# Patient Record
Sex: Male | Born: 1975 | Race: Black or African American | Hispanic: No | Marital: Married
Health system: Southern US, Community
[De-identification: ages and names within clinical notes are randomized; demographics above are authoritative.]

---

## 2020-03-20 ENCOUNTER — Ambulatory Visit: Payer: Self-pay

## 2020-08-08 ENCOUNTER — Ambulatory Visit
Admission: RE | Admit: 2020-08-08 | Discharge: 2020-08-08 | Disposition: A | Discharge status: Home / Self Care | Payer: BC Managed Care – PPO | Source: Ambulatory Visit | Attending: Internal Medicine | Admitting: Internal Medicine

## 2020-08-08 ENCOUNTER — Other Ambulatory Visit: Payer: Self-pay | Admitting: Internal Medicine

## 2020-08-08 DIAGNOSIS — M5430 Sciatica, unspecified side: Secondary | ICD-10-CM

## 2020-12-22 ENCOUNTER — Observation Stay (HOSPITAL_COMMUNITY)
Admission: EM | Admit: 2020-12-22 | Discharge: 2020-12-23 | Disposition: A | Discharge status: Home / Self Care | Payer: BC Managed Care – PPO | Attending: Neurological Surgery | Admitting: Neurological Surgery

## 2020-12-22 ENCOUNTER — Emergency Department (HOSPITAL_COMMUNITY): Payer: BC Managed Care – PPO

## 2020-12-22 DIAGNOSIS — Z419 Encounter for procedure for purposes other than remedying health state, unspecified: Secondary | ICD-10-CM

## 2020-12-22 DIAGNOSIS — M5416 Radiculopathy, lumbar region: Secondary | ICD-10-CM | POA: Diagnosis present

## 2020-12-22 DIAGNOSIS — U071 COVID-19: Secondary | ICD-10-CM | POA: Diagnosis not present

## 2020-12-22 DIAGNOSIS — M5116 Intervertebral disc disorders with radiculopathy, lumbar region: Secondary | ICD-10-CM | POA: Insufficient documentation

## 2020-12-22 DIAGNOSIS — G834 Cauda equina syndrome: Secondary | ICD-10-CM | POA: Diagnosis not present

## 2020-12-22 DIAGNOSIS — M48061 Spinal stenosis, lumbar region without neurogenic claudication: Secondary | ICD-10-CM | POA: Insufficient documentation

## 2020-12-22 DIAGNOSIS — M5126 Other intervertebral disc displacement, lumbar region: Secondary | ICD-10-CM

## 2020-12-22 DIAGNOSIS — R200 Anesthesia of skin: Secondary | ICD-10-CM

## 2020-12-22 LAB — CBC WITH DIFFERENTIAL/PLATELET
Abs Immature Granulocytes: 0.02 10*3/uL (ref 0.00–0.07)
Basophils Absolute: 0 10*3/uL (ref 0.0–0.1)
Basophils Relative: 0 %
Eosinophils Absolute: 0 10*3/uL (ref 0.0–0.5)
Eosinophils Relative: 0 %
HCT: 44 % (ref 39.0–52.0)
Hemoglobin: 15.6 g/dL (ref 13.0–17.0)
Immature Granulocytes: 0 %
Lymphocytes Relative: 20 %
Lymphs Abs: 1.3 10*3/uL (ref 0.7–4.0)
MCH: 29.4 pg (ref 26.0–34.0)
MCHC: 35.5 g/dL (ref 30.0–36.0)
MCV: 83 fL (ref 80.0–100.0)
Monocytes Absolute: 0.3 10*3/uL (ref 0.1–1.0)
Monocytes Relative: 4 %
Neutro Abs: 4.9 10*3/uL (ref 1.7–7.7)
Neutrophils Relative %: 76 %
Platelets: 207 10*3/uL (ref 150–400)
RBC: 5.3 MIL/uL (ref 4.22–5.81)
RDW: 11.5 % (ref 11.5–15.5)
WBC: 6.6 10*3/uL (ref 4.0–10.5)
nRBC: 0 % (ref 0.0–0.2)

## 2020-12-22 LAB — URINALYSIS, ROUTINE W REFLEX MICROSCOPIC
Bilirubin Urine: NEGATIVE
Glucose, UA: NEGATIVE mg/dL
Hgb urine dipstick: NEGATIVE
Ketones, ur: 20 mg/dL — AB
Leukocytes,Ua: NEGATIVE
Nitrite: NEGATIVE
Protein, ur: NEGATIVE mg/dL
Specific Gravity, Urine: 1.013 (ref 1.005–1.030)
pH: 7 (ref 5.0–8.0)

## 2020-12-22 LAB — RESP PANEL BY RT-PCR (FLU A&B, COVID) ARPGX2
Influenza A by PCR: NEGATIVE
Influenza B by PCR: NEGATIVE
SARS Coronavirus 2 by RT PCR: POSITIVE — AB

## 2020-12-22 LAB — PROTIME-INR
INR: 1 (ref 0.8–1.2)
Prothrombin Time: 13.2 seconds (ref 11.4–15.2)

## 2020-12-22 LAB — COMPREHENSIVE METABOLIC PANEL
ALT: 46 U/L — ABNORMAL HIGH (ref 0–44)
AST: 45 U/L — ABNORMAL HIGH (ref 15–41)
Albumin: 3.7 g/dL (ref 3.5–5.0)
Alkaline Phosphatase: 62 U/L (ref 38–126)
Anion gap: 11 (ref 5–15)
BUN: 7 mg/dL (ref 6–20)
CO2: 27 mmol/L (ref 22–32)
Calcium: 9 mg/dL (ref 8.9–10.3)
Chloride: 97 mmol/L — ABNORMAL LOW (ref 98–111)
Creatinine, Ser: 1.08 mg/dL (ref 0.61–1.24)
GFR, Estimated: >60 mL/min (ref >60)
Glucose, Bld: 100 mg/dL — ABNORMAL HIGH (ref 70–99)
Potassium: 3.5 mmol/L (ref 3.5–5.1)
Sodium: 135 mmol/L (ref 135–145)
Total Bilirubin: 0.9 mg/dL (ref 0.3–1.2)
Total Protein: 7.4 g/dL (ref 6.5–8.1)

## 2020-12-22 LAB — APTT: aPTT: 20 seconds — ABNORMAL LOW (ref 24–36)

## 2020-12-22 LAB — HIV ANTIBODY (ROUTINE TESTING W REFLEX): HIV Screen 4th Generation wRfx: NONREACTIVE

## 2020-12-22 MED ORDER — CEFAZOLIN SODIUM-DEXTROSE 2-4 GM/100ML-% IV SOLN: 2.0000 g | INTRAVENOUS | Status: DC

## 2020-12-22 MED ORDER — GADOBUTROL 1 MMOL/ML IV SOLN
9.0000 mL | Freq: Once | INTRAVENOUS | Status: AC | PRN
  Administered 2020-12-22: 9 mL via INTRAVENOUS

## 2020-12-22 MED ORDER — SODIUM CHLORIDE 0.9% FLUSH: 3.0000 mL | INTRAVENOUS | Status: DC | PRN

## 2020-12-22 MED ORDER — DEXAMETHASONE SODIUM PHOSPHATE 10 MG/ML IJ SOLN
10.0000 mg | Freq: Once | INTRAMUSCULAR | Status: AC
  Administered 2020-12-22: 10 mg via INTRAVENOUS
  Filled 2020-12-22: qty 1

## 2020-12-22 MED ORDER — SENNOSIDES-DOCUSATE SODIUM 8.6-50 MG PO TABS: 1.0000 | ORAL_TABLET | Freq: Every evening | ORAL | Status: DC | PRN

## 2020-12-22 MED ORDER — METHOCARBAMOL 1000 MG/10ML IJ SOLN
500.0000 mg | Freq: Four times a day (QID) | INTRAVENOUS | Status: DC | PRN
  Filled 2020-12-22 (×2): qty 5

## 2020-12-22 MED ORDER — HYDROMORPHONE HCL 1 MG/ML IJ SOLN
0.5000 mg | Freq: Once | INTRAMUSCULAR | Status: AC
  Administered 2020-12-22: 0.5 mg via INTRAVENOUS
  Filled 2020-12-22: qty 1

## 2020-12-22 MED ORDER — SODIUM CHLORIDE 0.9 % IV SOLN: 250.0000 mL | INTRAVENOUS | Status: DC | PRN

## 2020-12-22 MED ORDER — HYDROCODONE-ACETAMINOPHEN 5-325 MG PO TABS: 1.0000 | ORAL_TABLET | ORAL | Status: DC | PRN

## 2020-12-22 MED ORDER — ONDANSETRON HCL 4 MG PO TABS: 4.0000 mg | ORAL_TABLET | Freq: Four times a day (QID) | ORAL | Status: DC | PRN

## 2020-12-22 MED ORDER — SODIUM CHLORIDE 0.9% FLUSH
3.0000 mL | Freq: Two times a day (BID) | INTRAVENOUS | Status: DC
  Administered 2020-12-22: 3 mL via INTRAVENOUS

## 2020-12-22 MED ORDER — ONDANSETRON HCL 4 MG/2ML IJ SOLN: 4.0000 mg | Freq: Four times a day (QID) | INTRAMUSCULAR | Status: DC | PRN

## 2020-12-22 MED ORDER — SODIUM CHLORIDE 0.9 % IV SOLN: INTRAVENOUS | Status: DC

## 2020-12-22 NOTE — ED Triage Notes (Signed)
Pt here ems COVID+ on 12-28. pt received covid vax x2 & received booster on 12/23. Pt co bilateral leg pain from hip to toes feels tingling in nature. Denies injury to area. Denies calf pain and RN palpated bilateral dorsal pulses.

## 2020-12-22 NOTE — H&P (Signed)
Providing Compassionate, Quality Care - Together  NEUROSURGERY HISTORY & PHYSICAL   Allen Page is an 44 y.o. male.   Chief Complaint: Bilateral lower extremity numbness and difficulty urinating HPI: This is a pleasant 44 year old male with no significant past medical history that has been treated as an outpatient for low back pain and herniated disc.  He recently had epidural steroid injection a few weeks ago.  He has been dealing with low back pain and bilateral right greater than left lower extremity pain for months.  Over the past 3 months he has noted groin numbness, some difficulty initiating and knowing when he is urinating.  He denies any difficulty with bowel movements or urinary incontinence.  He denies any focal weakness but he does feel as though his legs are not as strong.  He complains of significant pain when he stands and it is now causing him significant pain to ambulate therefore he came to the emergency department.  No past medical history on file.   No family history on file. Social History:  has no history on file for tobacco use, alcohol use, and drug use.  Allergies: No Known Allergies    Results for orders placed or performed during the hospital encounter of 12/22/20 (from the past 48 hour(s))  Comprehensive metabolic panel     Status: Abnormal   Collection Time: 12/22/20  2:53 PM  Result Value Ref Range   Sodium 135 135 - 145 mmol/L   Potassium 3.5 3.5 - 5.1 mmol/L   Chloride 97 (L) 98 - 111 mmol/L   CO2 27 22 - 32 mmol/L   Glucose, Bld 100 (H) 70 - 99 mg/dL    Comment: Glucose reference range applies only to samples taken after fasting for at least 8 hours.   BUN 7 6 - 20 mg/dL   Creatinine, Ser 4.40 0.61 - 1.24 mg/dL   Calcium 9.0 8.9 - 10.2 mg/dL   Total Protein 7.4 6.5 - 8.1 g/dL   Albumin 3.7 3.5 - 5.0 g/dL   AST 45 (H) 15 - 41 U/L   ALT 46 (H) 0 - 44 U/L   Alkaline Phosphatase 62 38 - 126 U/L   Total Bilirubin 0.9 0.3 - 1.2 mg/dL    GFR, Estimated >72 >53 mL/min    Comment: (NOTE) Calculated using the CKD-EPI Creatinine Equation (2021)    Anion gap 11 5 - 15    Comment: Performed at [facility], [address] [place], [place] [address]  CBC with Differential     Status: None   Collection Time: 12/22/20  2:53 PM  Result Value Ref Range   WBC 6.6 4.0 - 10.5 K/uL   RBC 5.30 4.22 - 5.81 MIL/uL   Hemoglobin 15.6 13.0 - 17.0 g/dL   HCT 34.7 42.5 - 95.6 %   MCV 83.0 80.0 - 100.0 fL   MCH 29.4 26.0 - 34.0 pg   MCHC 35.5 30.0 - 36.0 g/dL   RDW 38.7 56.4 - 33.2 %   Platelets 207 150 - 400 K/uL   nRBC 0.0 0.0 - 0.2 %   Neutrophils Relative % 76 %   Neutro Abs 4.9 1.7 - 7.7 K/uL   Lymphocytes Relative 20 %   Lymphs Abs 1.3 0.7 - 4.0 K/uL   Monocytes Relative 4 %   Monocytes Absolute 0.3 0.1 - 1.0 K/uL   Eosinophils Relative 0 %   Eosinophils Absolute 0.0 0.0 - 0.5 K/uL   Basophils Relative 0 %  Basophils Absolute 0.0 0.0 - 0.1 K/uL   Immature Granulocytes 0 %   Abs Immature Granulocytes 0.02 0.00 - 0.07 K/uL    Comment: Performed at [facility], [address] [place], [place] [address]  Urinalysis, Routine w reflex microscopic Urine, Clean Catch     Status: Abnormal   Collection Time: 12/22/20  3:56 PM  Result Value Ref Range   Color, Urine YELLOW YELLOW   APPearance CLEAR CLEAR   Specific Gravity, Urine 1.013 1.005 - 1.030   pH 7.0 5.0 - 8.0   Glucose, UA NEGATIVE NEGATIVE mg/dL   Hgb urine dipstick NEGATIVE NEGATIVE   Bilirubin Urine NEGATIVE NEGATIVE   Ketones, ur 20 (A) NEGATIVE mg/dL   Protein, ur NEGATIVE NEGATIVE mg/dL   Nitrite NEGATIVE NEGATIVE   Leukocytes,Ua NEGATIVE NEGATIVE    Comment: Performed at [facility], [address] [place], [place] [address]  Resp Panel by RT-PCR (Flu A&B, Covid) Urine, Clean Catch     Status: Abnormal   Collection Time: 12/22/20  3:56 PM   Specimen: Urine, Clean Catch; Nasopharyngeal(NP) swabs in vial transport medium  Result Value Ref  Range   SARS Coronavirus 2 by RT PCR POSITIVE (A) NEGATIVE    Comment: RESULT CALLED TO, READ BACK BY AND VERIFIED WITH: E DIXON RN 1724 12/22/20 A BROWNING (NOTE) SARS-CoV-2 target nucleic acids are DETECTED.  The SARS-CoV-2 RNA is generally detectable in upper respiratory specimens during the acute phase of infection. Positive results are indicative of the presence of the identified virus, but do not rule out bacterial infection or co-infection with other pathogens not detected by the test. Clinical correlation with patient history and other diagnostic information is necessary to determine patient infection status. The expected result is Negative.  Fact Sheet for Patients: BloggerCourse.com  Fact Sheet for Healthcare Providers: SeriousBroker.it  This test is not yet approved or cleared by the [place] FDA and  has been authorized for detection and/or diagnosis of SARS-CoV-2 by FDA under an Emergency Use Authorization (EUA).  This EUA will remain in effect (meaning this test can  be used) for the duration of  the COVID-19 declaration under Section 564(b)(1) of the Act, 21 U.S.C. section 360bbb-3(b)(1), unless the authorization is terminated or revoked sooner.     Influenza A by PCR NEGATIVE NEGATIVE   Influenza B by PCR NEGATIVE NEGATIVE    Comment: (NOTE) The Xpert Xpress SARS-CoV-2/FLU/RSV plus assay is intended as an aid in the diagnosis of influenza from Nasopharyngeal swab specimens and should not be used as a sole basis for treatment. Nasal washings and aspirates are unacceptable for Xpert Xpress SARS-CoV-2/FLU/RSV testing.  Fact Sheet for Patients: BloggerCourse.com  Fact Sheet for Healthcare Providers: SeriousBroker.it  This test is not yet approved or cleared by the [place] FDA and has been authorized for detection and/or diagnosis of SARS-CoV-2 by FDA  under an Emergency Use Authorization (EUA). This EUA will remain in effect (meaning this test can be used) for the duration of the COVID-19 declaration under Section 564(b)(1) of the Act, 21 U.S.C. section 360bbb-3(b)(1), unless the authorization is terminated or revoked.  Performed at [facility], [address] [place], [place] [address]   Protime-INR     Status: None   Collection Time: 12/22/20  8:50 PM  Result Value Ref Range   Prothrombin Time 13.2 11.4 - 15.2 seconds   INR 1.0 0.8 - 1.2    Comment: (NOTE) INR goal varies based on device  and disease states. Performed at [facility], [address], [place], [place] [address]   APTT     Status: Abnormal   Collection Time: 12/22/20  8:50 PM  Result Value Ref Range   aPTT 20 (L) 24 - 36 seconds    Comment: Performed at [facility], [address], [place], [place] [address]   MR Lumbar Spine W Wo Contrast  Result Date: 12/22/2020 CLINICAL DATA:  Low back pain.  Suspect cauda equina syndrome. EXAM: MRI LUMBAR SPINE WITHOUT AND WITH CONTRAST TECHNIQUE: Multiplanar and multiecho pulse sequences of the lumbar spine were obtained without and with intravenous contrast. CONTRAST:  55mL GADAVIST GADOBUTROL 1 MMOL/ML IV SOLN COMPARISON:  Lumbar radiographs 08/08/2020 FINDINGS: Segmentation:  Normal Alignment:  Mild retrolisthesis L3-4 L4-5.  Lumbar lordosis. Vertebrae:  Normal bone marrow.  Negative for fracture or mass. Conus medullaris and cauda equina: Conus extends to the T12-L1 level. Conus and cauda equina appear normal. Paraspinal and other soft tissues: Negative for paraspinous mass or adenopathy. Disc levels: Congenitally small canal due to short pedicles. T12-L1: Shallow central disc protrusion without significant stenosis L1-2: Shallow central disc protrusion.  No significant stenosis. L2-3: Shallow central disc protrusion without significant stenosis. L3-4: Disc degeneration with diffuse disc bulging and endplate  spurring. Moderately large central and right-sided disc protrusion compressing the thecal sac right greater than left. Mild to moderate spinal stenosis. Mild facet degeneration. Moderate right subarticular stenosis. L4-5: Large extruded disc fragment extending into the spinal canal and compressing thecal sac causing severe spinal stenosis. Bilateral mild facet degeneration. Moderate to severe subarticular stenosis bilaterally L5-S1: Shallow central disc protrusion without significant stenosis IMPRESSION: 1. Congenital spinal stenosis. Shallow central disc protrusion T12-L1, L1-2, L2-3 2. Mild to moderate spinal stenosis L3-4 with moderate large central right-sided disc protrusion 3. Large extruded disc fragment centrally at L4-5 causing severe spinal stenosis. Moderate to severe subarticular stenosis bilaterally. Electronically Signed   By: Marlan Palau M.D.   On: 12/22/2020 18:43    ROS 14 point review of systems was obtained which all pertinent positives are listed in HPI above   Blood pressure 135/81, pulse 75, temperature 98.8 F (37.1 C), temperature source Oral, resp. rate 18, weight 90.7 kg, SpO2 99 %. Physical Exam  A&O x3 PERRLA EOMI Bilateral upper extremities 5/5 Cranial nerves II through XII intact Decrease sensation to groin, subjectively Decreased sensation to multiple dermatomes, L4, L5, S1 bilaterally Bilateral lower extremity 4/5 strength Deep tendon reflexes normal Per ED, rectal tone normal    Assessment/Plan 44 year old male with  1.  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  Cauda equina syndrome 3.  Covid positive   -OR tomorrow for L4-5 microdiscectomy -All risks, benefits, alternatives discussed with the patient and agreed upon.  I told him the risks that include but are not limited to heart attack, stroke, death, wound infection, hematoma, need for more surgery, CSF leak, permanent or temporary nerve damage.  He agrees to proceed. -pain  control -admit to floor -NPO midnight -Isolation for Covid status  Thank you for allowing me to participate in this patient's care.  Please do not hesitate to call with questions or concerns.   Monia Pouch, DO Neurosurgeon [facility] Cell: [phone]

## 2020-12-22 NOTE — ED Notes (Signed)
Pt still not back from MRI

## 2020-12-22 NOTE — ED Notes (Signed)
Pt tranported to MRI at this time.

## 2020-12-22 NOTE — ED Provider Notes (Signed)
[facility] EMERGENCY DEPARTMENT Provider Note   CSN: [id number] Arrival date & time: 12/22/20  1251     History Chief Complaint  Patient presents with  . Leg Pain    Allen Page is a 44 y.o. male with PMH/o sciatica, positive RPR test, recent COVID positive who presents for evaluation of leg pain, leg numbness/weakness.  He states he has a history of lower back issues and states that he has been seen for back problems before.  He states that he has had bad, had pain from that sometimes down his left leg.  He states he got injection in his back and his hip a couple weeks ago to help with the pain.  He also reports he got his flu shot and Covid booster about 2 to 3 weeks ago.  Reports that he did an at-home test and it was positive.  He states that over the last 2 to 3 days, he has felt some pain and tingling in his legs.  He states that his both his legs but his right one is worse.  He states that towards his foot and ankle and lower leg, he feels a tingling sensation and cannot really feel as well as he normally can.  He states that this goes up all the way to his thighs and his lower back.  He states that he has also had difficulty urinating.  He states that he feels the urge to go but then when he goes, he cannot urinate.  He states he had to sit on the toilet for about 30 minutes before he can go.  He states that sometimes when he goes, he does not feel that position that he has not had any episodes where he has urinated or had a bowel movement on himself.  He reports that he has some numbness in his groin as well.  He states he has had difficulty walking secondary symptoms.  He reports he is normally able to ambulate without any difficulty.  He denies any history of trauma, injury, fall.  He is never had any back surgery and denies any IV drug use.  He has not noted any fevers.  Denies any chest pain, difficulty breathing.   The history is provided by the patient.        No past medical history on file.  Patient Active Problem List   Diagnosis Date Noted  . Cauda equina compression (HCC) 12/22/2020     The histories are not reviewed yet. Please review them in the "History" navigator section and refresh this SmartLink.     No family history on file.     Home Medications Prior to Admission medications   Medication Sig Start Date End Date Taking? Authorizing Provider  diclofenac (VOLTAREN) 75 MG EC tablet Take 75 mg by mouth 2 (two) times daily. 12/06/20  Yes [provider]  gabapentin (NEURONTIN) 300 MG capsule Take 300 mg by mouth daily. 08/22/20  Yes [provider]  Multiple Vitamin (MULTIVITAMIN) tablet Take 1 tablet by mouth daily.   Yes [provider]  traMADol (ULTRAM) 50 MG tablet Take 50 mg by mouth every 6 (six) hours as needed for moderate pain. 08/10/20  Yes [provider]    Allergies    Patient has no known allergies.  Review of Systems   Review of Systems  Constitutional: Negative for fever.  Respiratory: Negative for cough and shortness of breath.   Cardiovascular: Negative for chest pain.  Gastrointestinal:  Negative for abdominal pain, nausea and vomiting.  Genitourinary: Positive for difficulty urinating.  Musculoskeletal: Positive for back pain.  Neurological: Positive for weakness and numbness.  All other systems reviewed and are negative.   Physical Exam Updated Vital Signs BP 135/81   Pulse 75   Temp 98.8 F (37.1 C) (Oral)   Resp 18   Wt 90.7 kg   SpO2 99%   Physical Exam Vitals and nursing note reviewed. Exam conducted with a chaperone present.  Constitutional:      Appearance: Normal appearance. He is well-developed and well-nourished.  HENT:     Head: Normocephalic and atraumatic.     Mouth/Throat:     Mouth: Oropharynx is clear and moist and mucous membranes are normal.  Eyes:     General: Lids are normal.     Extraocular Movements: EOM normal.      Conjunctiva/sclera: Conjunctivae normal.     Pupils: Pupils are equal, round, and reactive to light.  Cardiovascular:     Rate and Rhythm: Normal rate and regular rhythm.     Pulses: Normal pulses.          Radial pulses are 2+ on the right side and 2+ on the left side.       Dorsalis pedis pulses are 2+ on the right side and 2+ on the left side.     Heart sounds: Normal heart sounds. No murmur heard. No friction rub. No gallop.   Pulmonary:     Effort: Pulmonary effort is normal.     Breath sounds: Normal breath sounds.     Comments: Lungs clear to auscultation bilaterally.  Symmetric chest rise.  No wheezing, rales, rhonchi. Abdominal:     Palpations: Abdomen is soft. Abdomen is not rigid.     Tenderness: There is no abdominal tenderness. There is no guarding.     Comments: Abdomen is soft, non-distended, non-tender. No rigidity, No guarding. No peritoneal signs.  Genitourinary:    Comments: The exam was performed with a chaperone present Irving Burton, RN). Normal anal spinchter tone. Reports decreased sensation to palpation of scrotum.  Musculoskeletal:        General: Normal range of motion.     Cervical back: Full passive range of motion without pain.     Comments: Mild tenderness noted diffusely to the lower lumbar region. No overlying warmth, erythema or edema.   Skin:    General: Skin is warm and dry.     Capillary Refill: Capillary refill takes less than 2 seconds.  Neurological:     Mental Status: He is alert and oriented to person, place, and time.     Deep Tendon Reflexes:     Reflex Scores:      Patellar reflexes are 2+ on the right side and 2+ on the left side.      Achilles reflexes are 1+ on the right side and 2+ on the left side.    Comments: Reports decreased sensation noted from the mid tib-fib of the right lower leg that extends distally.  He also reports tingling noted to the left lower extremity.  Painful stimuli noted to the plantar surface of the right lower  extremity, he minimally responds whereas with the left, he has more response.  2+ patellar reflexes bilaterally.  Slightly diminished Achilles reflex on the right lower extremity.  5/5 strength of bilateral upper extremities.  4/5 strength bilateral lower extremities.  Difficulty ambulating secondary to ataxia.  Psychiatric:  Mood and Affect: Mood and affect normal.        Speech: Speech normal.     ED Results / Procedures / Treatments   Labs (all labs ordered are listed, but only abnormal results are displayed) Labs Reviewed  RESP PANEL BY RT-PCR (FLU A&B, COVID) ARPGX2 - Abnormal; Notable for the following components:      Result Value   SARS Coronavirus 2 by RT PCR POSITIVE (*)    All other components within normal limits  COMPREHENSIVE METABOLIC PANEL - Abnormal; Notable for the following components:   Chloride 97 (*)    Glucose, Bld 100 (*)    AST 45 (*)    ALT 46 (*)    All other components within normal limits  URINALYSIS, ROUTINE W REFLEX MICROSCOPIC - Abnormal; Notable for the following components:   Ketones, ur 20 (*)    All other components within normal limits  APTT - Abnormal; Notable for the following components:   aPTT 20 (*)    All other components within normal limits  CBC WITH DIFFERENTIAL/PLATELET  PROTIME-INR  HIV ANTIBODY (ROUTINE TESTING W REFLEX)    EKG None  Radiology MR Lumbar Spine W Wo Contrast  Result Date: 12/22/2020 CLINICAL DATA:  Low back pain.  Suspect cauda equina syndrome. EXAM: MRI LUMBAR SPINE WITHOUT AND WITH CONTRAST TECHNIQUE: Multiplanar and multiecho pulse sequences of the lumbar spine were obtained without and with intravenous contrast. CONTRAST:  68mL GADAVIST GADOBUTROL 1 MMOL/ML IV SOLN COMPARISON:  Lumbar radiographs 08/08/2020 FINDINGS: Segmentation:  Normal Alignment:  Mild retrolisthesis L3-4 L4-5.  Lumbar lordosis. Vertebrae:  Normal bone marrow.  Negative for fracture or mass. Conus medullaris and cauda equina: Conus  extends to the T12-L1 level. Conus and cauda equina appear normal. Paraspinal and other soft tissues: Negative for paraspinous mass or adenopathy. Disc levels: Congenitally small canal due to short pedicles. T12-L1: Shallow central disc protrusion without significant stenosis L1-2: Shallow central disc protrusion.  No significant stenosis. L2-3: Shallow central disc protrusion without significant stenosis. L3-4: Disc degeneration with diffuse disc bulging and endplate spurring. Moderately large central and right-sided disc protrusion compressing the thecal sac right greater than left. Mild to moderate spinal stenosis. Mild facet degeneration. Moderate right subarticular stenosis. L4-5: Large extruded disc fragment extending into the spinal canal and compressing thecal sac causing severe spinal stenosis. Bilateral mild facet degeneration. Moderate to severe subarticular stenosis bilaterally L5-S1: Shallow central disc protrusion without significant stenosis IMPRESSION: 1. Congenital spinal stenosis. Shallow central disc protrusion T12-L1, L1-2, L2-3 2. Mild to moderate spinal stenosis L3-4 with moderate large central right-sided disc protrusion 3. Large extruded disc fragment centrally at L4-5 causing severe spinal stenosis. Moderate to severe subarticular stenosis bilaterally. Electronically Signed   By: Marlan Palau M.D.   On: 12/22/2020 18:43    Procedures Procedures (including critical care time)  Medications Ordered in ED Medications  sodium chloride flush (NS) 0.9 % injection 3 mL (has no administration in time range)  sodium chloride flush (NS) 0.9 % injection 3 mL (has no administration in time range)  0.9 %  sodium chloride infusion (has no administration in time range)  0.9 %  sodium chloride infusion (has no administration in time range)  HYDROcodone-acetaminophen (NORCO/VICODIN) 5-325 MG per tablet 1-2 tablet (has no administration in time range)  methocarbamol (ROBAXIN) 500 mg in dextrose  5 % 50 mL IVPB (has no administration in time range)  senna-docusate (Senokot-S) tablet 1 tablet (has no administration in time range)  ondansetron (ZOFRAN)  tablet 4 mg (has no administration in time range)    Or  ondansetron (ZOFRAN) injection 4 mg (has no administration in time range)  gadobutrol (GADAVIST) 1 MMOL/ML injection 9 mL (9 mLs Intravenous Contrast Given 12/22/20 1821)  HYDROmorphone (DILAUDID) injection 0.5 mg (0.5 mg Intravenous Given 12/22/20 1914)  dexamethasone (DECADRON) injection 10 mg (10 mg Intravenous Given 12/22/20 1914)    ED Course  I have reviewed the triage vital signs and the nursing notes.  Pertinent labs & imaging results that were available during my care of the patient were reviewed by me and considered in my medical decision making (see chart for details).    MDM Rules/Calculators/A&P                          44 year old male who presents for evaluation of bilateral lower leg tingling/numbness, difficulty urinating, numbness to his perineum this been ongoing for about 2 to 3 days.  Reports he has a history of back issues and states he got an injection for his back and hip about 2 to 3 weeks ago.  Also reports recently got flu shot and Covid booster.  Took it at home test on 12/28 was positive for Covid.  Comes in today for 2 to 3 days of leg pain/numbness and tingling and tingling noted to his perineum.  No history of IV drug use.  No history of back surgeries.  No fevers, chest pain, difficulty breathing.  Initial arrival, he is afebrile, nontoxic-appearing.  Vital signs are stable.  He has 2+ patellar reflexes bilaterally with diminished Achilles reflex on right lower extremity as well as decreased sensation to pain.  He has difficulty ambulating secondary gait ataxia.  Concern for cauda equina versus transverse myelitis.  We will plan for MRI, lab work.  Patient is COVID positive. UA shows no infection. CMP shows normal BUN/Cr. CBC shows no leukocytosis or  anemia.   MRI of L spine with and without shows congenital spinal stenosis with shallow central disc potrusion She has mild to moderate spinal stenosis of L3-L3 with moderate large central right sided disc protrusion. He has a large extruded disc fragment centrally at L4-5 causing severe spianl stenosis. He has moderate to severe subarticular stenosis noted bilaterally.   Discussed patient with Dr. Jake Samplesawley (Neurosurgery) who will evaluate the MRI.  Discussed patient with Dr. Jake Samplesawley (Neurosurgery). He will admit the patient with plans for operative repair tomorrow.  Updated patient on plan. He is agreeable.    Portions of this note were generated with Scientist, clinical (histocompatibility and immunogenetics)Dragon dictation software. Dictation errors may occur despite best attempts at proofreading.   Final Clinical Impression(s) / ED Diagnoses Final diagnoses:  Numbness in both legs  Lumbar herniated disc    Rx / DC Orders ED Discharge Orders    None       Rosana HoesLayden, Siedah Sedor A, PA-C 12/22/20 2212    Arby BarrettePfeiffer, Marcy, MD 12/23/20 1525

## 2020-12-23 ENCOUNTER — Observation Stay (HOSPITAL_COMMUNITY): Payer: BC Managed Care – PPO | Admitting: Certified Registered Nurse Anesthetist

## 2020-12-23 ENCOUNTER — Observation Stay (HOSPITAL_COMMUNITY): Payer: BC Managed Care – PPO

## 2020-12-23 ENCOUNTER — Encounter (HOSPITAL_COMMUNITY)
Admission: EM | Disposition: A | Discharge status: Home / Self Care | Payer: Self-pay | Source: Home / Self Care | Attending: Emergency Medicine

## 2020-12-23 HISTORY — PX: LUMBAR LAMINECTOMY/DECOMPRESSION MICRODISCECTOMY: SHX5026

## 2020-12-23 SURGERY — LUMBAR LAMINECTOMY/DECOMPRESSION MICRODISCECTOMY 2 LEVELS
Anesthesia: General | Site: Spine Lumbar | Laterality: Right

## 2020-12-23 MED ORDER — ONDANSETRON HCL 4 MG/2ML IJ SOLN
INTRAMUSCULAR | Status: DC | PRN
  Administered 2020-12-23: 4 mg via INTRAVENOUS

## 2020-12-23 MED ORDER — HEMOSTATIC AGENTS (NO CHARGE) OPTIME
TOPICAL | Status: DC | PRN
  Administered 2020-12-23: 1 via TOPICAL

## 2020-12-23 MED ORDER — BUPIVACAINE-EPINEPHRINE (PF) 0.5% -1:200000 IJ SOLN
INTRAMUSCULAR | Status: DC | PRN
  Administered 2020-12-23: 5 mL

## 2020-12-23 MED ORDER — LIDOCAINE 2% (20 MG/ML) 5 ML SYRINGE
INTRAMUSCULAR | Status: AC
  Filled 2020-12-23: qty 5

## 2020-12-23 MED ORDER — THROMBIN 5000 UNITS EX SOLR
CUTANEOUS | Status: AC
  Filled 2020-12-23: qty 5000

## 2020-12-23 MED ORDER — PROPOFOL 10 MG/ML IV BOLUS
INTRAVENOUS | Status: DC | PRN
  Administered 2020-12-23: 200 mg via INTRAVENOUS

## 2020-12-23 MED ORDER — MIDAZOLAM HCL 5 MG/5ML IJ SOLN
INTRAMUSCULAR | Status: DC | PRN
  Administered 2020-12-23: 2 mg via INTRAVENOUS

## 2020-12-23 MED ORDER — SUCCINYLCHOLINE CHLORIDE 200 MG/10ML IV SOSY
PREFILLED_SYRINGE | INTRAVENOUS | Status: DC | PRN
  Administered 2020-12-23: 120 mg via INTRAVENOUS

## 2020-12-23 MED ORDER — FENTANYL CITRATE (PF) 250 MCG/5ML IJ SOLN
INTRAMUSCULAR | Status: AC
  Filled 2020-12-23: qty 5

## 2020-12-23 MED ORDER — LACTATED RINGERS IV SOLN: INTRAVENOUS | Status: DC | PRN

## 2020-12-23 MED ORDER — FENTANYL CITRATE (PF) 100 MCG/2ML IJ SOLN
INTRAMUSCULAR | Status: AC
  Filled 2020-12-23: qty 2

## 2020-12-23 MED ORDER — PROMETHAZINE HCL 25 MG/ML IJ SOLN: 6.2500 mg | INTRAMUSCULAR | Status: DC | PRN

## 2020-12-23 MED ORDER — METHYLPREDNISOLONE ACETATE 80 MG/ML IJ SUSP
INTRAMUSCULAR | Status: DC | PRN
  Administered 2020-12-23: 80 mg

## 2020-12-23 MED ORDER — METHOCARBAMOL 500 MG PO TABS: 500.0000 mg | ORAL_TABLET | Freq: Three times a day (TID) | ORAL | 1 refills | Status: AC | PRN

## 2020-12-23 MED ORDER — ACETAMINOPHEN 10 MG/ML IV SOLN
INTRAVENOUS | Status: AC
  Filled 2020-12-23: qty 100

## 2020-12-23 MED ORDER — SUGAMMADEX SODIUM 200 MG/2ML IV SOLN
INTRAVENOUS | Status: DC | PRN
  Administered 2020-12-23: 180 mg via INTRAVENOUS

## 2020-12-23 MED ORDER — PROPOFOL 10 MG/ML IV BOLUS
INTRAVENOUS | Status: AC
  Filled 2020-12-23: qty 20

## 2020-12-23 MED ORDER — DEXAMETHASONE SODIUM PHOSPHATE 10 MG/ML IJ SOLN
INTRAMUSCULAR | Status: DC | PRN
  Administered 2020-12-23: 10 mg via INTRAVENOUS

## 2020-12-23 MED ORDER — ROCURONIUM BROMIDE 10 MG/ML (PF) SYRINGE
PREFILLED_SYRINGE | INTRAVENOUS | Status: DC | PRN
  Administered 2020-12-23: 60 mg via INTRAVENOUS

## 2020-12-23 MED ORDER — THROMBIN 5000 UNITS EX SOLR
CUTANEOUS | Status: DC | PRN
  Administered 2020-12-23 (×2): 5000 [IU] via TOPICAL

## 2020-12-23 MED ORDER — LIDOCAINE-EPINEPHRINE 1 %-1:100000 IJ SOLN
INTRAMUSCULAR | Status: DC | PRN
  Administered 2020-12-23: 5 mL

## 2020-12-23 MED ORDER — ONDANSETRON HCL 4 MG/2ML IJ SOLN
INTRAMUSCULAR | Status: AC
  Filled 2020-12-23: qty 2

## 2020-12-23 MED ORDER — BUPIVACAINE-EPINEPHRINE 0.5% -1:200000 IJ SOLN
INTRAMUSCULAR | Status: AC
  Filled 2020-12-23: qty 1

## 2020-12-23 MED ORDER — LIDOCAINE-EPINEPHRINE 1 %-1:100000 IJ SOLN
INTRAMUSCULAR | Status: AC
  Filled 2020-12-23: qty 1

## 2020-12-23 MED ORDER — THROMBIN 5000 UNITS EX SOLR: OROMUCOSAL | Status: DC | PRN

## 2020-12-23 MED ORDER — ROCURONIUM BROMIDE 10 MG/ML (PF) SYRINGE
PREFILLED_SYRINGE | INTRAVENOUS | Status: AC
  Filled 2020-12-23: qty 10

## 2020-12-23 MED ORDER — FENTANYL CITRATE (PF) 250 MCG/5ML IJ SOLN
INTRAMUSCULAR | Status: DC | PRN
  Administered 2020-12-23: 100 ug via INTRAVENOUS
  Administered 2020-12-23: 50 ug via INTRAVENOUS
  Administered 2020-12-23: 100 ug via INTRAVENOUS

## 2020-12-23 MED ORDER — ACETAMINOPHEN 10 MG/ML IV SOLN
INTRAVENOUS | Status: DC | PRN
  Administered 2020-12-23: 1000 mg via INTRAVENOUS

## 2020-12-23 MED ORDER — LIDOCAINE 2% (20 MG/ML) 5 ML SYRINGE
INTRAMUSCULAR | Status: DC | PRN
  Administered 2020-12-23: 100 mg via INTRAVENOUS

## 2020-12-23 MED ORDER — 0.9 % SODIUM CHLORIDE (POUR BTL) OPTIME
TOPICAL | Status: DC | PRN
  Administered 2020-12-23: 1000 mL

## 2020-12-23 MED ORDER — MIDAZOLAM HCL 2 MG/2ML IJ SOLN
INTRAMUSCULAR | Status: AC
  Filled 2020-12-23: qty 2

## 2020-12-23 MED ORDER — HYDROCODONE-ACETAMINOPHEN 5-325 MG PO TABS
1.0000 | ORAL_TABLET | ORAL | 0 refills | Status: AC | PRN
Start: 2020-12-23 — End: ?

## 2020-12-23 MED ORDER — DEXAMETHASONE SODIUM PHOSPHATE 10 MG/ML IJ SOLN
INTRAMUSCULAR | Status: AC
  Filled 2020-12-23: qty 1

## 2020-12-23 MED ORDER — METHYLPREDNISOLONE ACETATE 80 MG/ML IJ SUSP
INTRAMUSCULAR | Status: AC
  Filled 2020-12-23: qty 1

## 2020-12-23 MED ORDER — FENTANYL CITRATE (PF) 100 MCG/2ML IJ SOLN: 25.0000 ug | INTRAMUSCULAR | Status: DC | PRN

## 2020-12-23 SURGICAL SUPPLY — 46 items
BAND RUBBER #18 3X1/16 STRL (MISCELLANEOUS) ×6 IMPLANT
BUR CARBIDE MATCH 3.0 (BURR) ×3 IMPLANT
CARTRIDGE OIL MAESTRO DRILL (MISCELLANEOUS) ×1 IMPLANT
CNTNR URN SCR LID CUP LEK RST (MISCELLANEOUS) ×1 IMPLANT
CONT SPEC 4OZ STRL OR WHT (MISCELLANEOUS) ×2
COVER MAYO STAND STRL (DRAPES) ×3 IMPLANT
DECANTER SPIKE VIAL GLASS SM (MISCELLANEOUS) ×6 IMPLANT
DERMABOND ADVANCED (GAUZE/BANDAGES/DRESSINGS) ×2
DERMABOND ADVANCED .7 DNX12 (GAUZE/BANDAGES/DRESSINGS) ×1 IMPLANT
DIFFUSER DRILL AIR PNEUMATIC (MISCELLANEOUS) ×3 IMPLANT
DRAPE C-ARM 42X72 X-RAY (DRAPES) ×3 IMPLANT
DRAPE LAPAROTOMY 100X72X124 (DRAPES) ×3 IMPLANT
DRAPE MICROSCOPE LEICA (MISCELLANEOUS) ×3 IMPLANT
DRAPE SURG 17X23 STRL (DRAPES) ×3 IMPLANT
DRSG OPSITE POSTOP 3X4 (GAUZE/BANDAGES/DRESSINGS) ×3 IMPLANT
DURAPREP 26ML APPLICATOR (WOUND CARE) ×3 IMPLANT
ELECT BLADE INSULATED 4IN (ELECTROSURGICAL) ×3
ELECT REM PT RETURN 9FT ADLT (ELECTROSURGICAL) ×3
ELECTRODE BLADE INSULATED 4IN (ELECTROSURGICAL) ×1 IMPLANT
ELECTRODE REM PT RTRN 9FT ADLT (ELECTROSURGICAL) ×1 IMPLANT
GAUZE SPONGE 4X4 12PLY STRL (GAUZE/BANDAGES/DRESSINGS) ×3 IMPLANT
GLOVE BIOGEL PI IND STRL 8 (GLOVE) ×1 IMPLANT
GLOVE BIOGEL PI INDICATOR 8 (GLOVE) ×2
GLOVE ECLIPSE 8.0 STRL XLNG CF (GLOVE) ×3 IMPLANT
GOWN STRL REUS W/ TWL LRG LVL3 (GOWN DISPOSABLE) IMPLANT
GOWN STRL REUS W/ TWL XL LVL3 (GOWN DISPOSABLE) ×1 IMPLANT
GOWN STRL REUS W/TWL 2XL LVL3 (GOWN DISPOSABLE) IMPLANT
GOWN STRL REUS W/TWL LRG LVL3 (GOWN DISPOSABLE)
GOWN STRL REUS W/TWL XL LVL3 (GOWN DISPOSABLE) ×2
KIT BASIN OR (CUSTOM PROCEDURE TRAY) ×3 IMPLANT
KIT POSITION SURG JACKSON T1 (MISCELLANEOUS) ×3 IMPLANT
KIT TURNOVER KIT B (KITS) ×3 IMPLANT
MARKER SKIN DUAL TIP RULER LAB (MISCELLANEOUS) ×3 IMPLANT
NEEDLE HYPO 25X1 1.5 SAFETY (NEEDLE) ×3 IMPLANT
NS IRRIG 1000ML POUR BTL (IV SOLUTION) ×3 IMPLANT
OIL CARTRIDGE MAESTRO DRILL (MISCELLANEOUS) ×3
PACK LAMINECTOMY NEURO (CUSTOM PROCEDURE TRAY) ×3 IMPLANT
SPONGE LAP 4X18 RFD (DISPOSABLE) IMPLANT
SPONGE SURGIFOAM ABS GEL SZ50 (HEMOSTASIS) ×3 IMPLANT
SUT VIC AB 0 CT1 27 (SUTURE) ×2
SUT VIC AB 0 CT1 27XBRD ANBCTR (SUTURE) ×1 IMPLANT
SUT VIC AB 2-0 CP2 18 (SUTURE) ×3 IMPLANT
SUT VIC AB 3-0 SH 8-18 (SUTURE) IMPLANT
TOWEL GREEN STERILE (TOWEL DISPOSABLE) ×3 IMPLANT
TOWEL GREEN STERILE FF (TOWEL DISPOSABLE) ×3 IMPLANT
WATER STERILE IRR 1000ML POUR (IV SOLUTION) ×3 IMPLANT

## 2020-12-23 NOTE — ED Notes (Signed)
Please call the wife Lorella Nimrod at [phone] with an update. She was not able to get in touch with the dr.

## 2020-12-23 NOTE — ED Notes (Signed)
$  675.00, pt wallet and pt keys placed in security.  Pt requested his phone and charger and wedding ring to be placed in his belongings bag.

## 2020-12-23 NOTE — Discharge Summary (Signed)
  Physician Discharge Summary  Patient ID: Allen Page MRN: [id number] DOB/AGE: 02-16-1976 45 y.o.  Admit date: 12/22/2020 Discharge date: 12/23/2020  Admission Diagnoses:  Lumbar radiculopathy, cauda equina syndrome  Discharge Diagnoses:  Same Active Problems:   Cauda equina compression [facility])   Discharged Condition: Stable  Hospital Course:  Allen Page is a 45 y.o. male noted with bilateral lower extremity pain, perineal numbness and difficulty with urination.  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  He underwent an L4-5 right microdiscectomy on 12/23/2020.  He tolerated the surgery well and his pain was improved postoperatively.  He was ambulating well, his pain was controlled and having normal bowel and bladder function upon discharge.  Instructions were given for his postoperative care and appointments.  Treatments: Surgery -right L4-5 microdiscectomy on 12/23/2020  Discharge Exam: Blood pressure 121/77, pulse 76, temperature 98.8 F (37.1 C), temperature source Oral, resp. rate 18, weight 90.7 kg, SpO2 100 %. Awake, alert, oriented Speech fluent, appropriate CN grossly intact 5/5 BUE 4+/5 BLE Wound c/d/i  Disposition: Discharge disposition: 01-Home or Self Care        Allergies as of 12/23/2020   No Known Allergies     Medication List    STOP taking these medications   diclofenac 75 MG EC tablet Commonly known as: VOLTAREN   traMADol 50 MG tablet Commonly known as: ULTRAM     TAKE these medications   gabapentin 300 MG capsule Commonly known as: NEURONTIN Take 300 mg by mouth daily.   HYDROcodone-acetaminophen 5-325 MG tablet Commonly known as: NORCO/VICODIN Take 1-2 tablets by mouth every 4 (four) hours as needed for moderate pain.   methocarbamol 500 MG tablet Commonly known as: Robaxin Take 1 tablet (500 mg total) by mouth every 8 (eight) hours as needed for muscle spasms.   multivitamin  tablet Take 1 tablet by mouth daily.       Follow-up Information    Deniel Mcquiston C, DO Follow up in 2 week(s).   Why: call for postop appointment before 1/12 Contact information: [address] [place] [address] [phone]               Signed: Alan Mulder Tavon Corriher 12/23/2020, 1:57 PM

## 2020-12-23 NOTE — Anesthesia Postprocedure Evaluation (Signed)
Anesthesia Post Note  Patient: Allen Page  Procedure(s) Performed: RIGHT LUMBAR FOUR-FIVE LUMBAR LAMINECTOMY/DECOMPRESSION MICRODISCECTOMY (Right Spine Lumbar)     Patient location during evaluation: PACU Anesthesia Type: General Level of consciousness: awake and alert Pain management: pain level controlled Vital Signs Assessment: post-procedure vital signs reviewed and stable Respiratory status: spontaneous breathing, nonlabored ventilation, respiratory function stable and patient connected to nasal cannula oxygen Cardiovascular status: blood pressure returned to baseline and stable Postop Assessment: no apparent nausea or vomiting Anesthetic complications: no   No complications documented.  Last Vitals:  Vitals:   12/23/20 1433 12/23/20 1448  BP: 140/90 (!) 137/95  Pulse: 60 64  Resp: 18 15  Temp:  36.5 C  SpO2: 99% 100%    Last Pain:  Vitals:   12/23/20 1448  TempSrc:   PainSc: 0-No pain                 Cecile Hearing

## 2020-12-23 NOTE — Discharge Instructions (Signed)
Herniated Disk  A herniated disk happens when a disk in the spine bulges out too far. There is an oval disk between each pair of bones (vertebrae) in the backbone, or spine. The disks connect the bones, help the spine move, and keep the bones from rubbing against each other when you move. A herniated disk can happen anywhere in the back or neck area. It most often affects the lower back. What are the causes? This condition may be caused by:  Wear and tear as you age.  Sudden injury, such as a strain or sprain. What increases the risk? The following factors may make you more likely to develop this condition:  Aging. This is the main thing that raises your risk.  Being a man who is 30-50 years old.  Frequently doing activities that involve heavy lifting, bending, or twisting.  Not getting enough exercise.  Being overweight.  Using tobacco products. What are the signs or symptoms? Symptoms may vary depending on where your herniated disk is in your body.  A herniated disk in the lower back may cause sharp pain in: ? Part of the arm, leg, hip, or butt. ? The back of the lower leg (calf). ? The lower back, spreading down through the leg into the foot (sciatica).  A herniated disk in the neck may cause you to feel dizzy and feel that things around you are moving when they are not (vertigo). It may also cause pain or weakness in: ? The neck. ? The shoulder blades. ? The upper arm, lower arm, or fingers. You may also have muscle weakness. It may be hard to:  Lift your leg or arm.  Stand on your toes.  Squeeze tightly with one of your hands. Other symptoms may include:  Loss of feeling (numbness) or tingling in the affected areas of the hands, arms, feet, or legs.  Being unable to control when you pee (urinate), or when you poop (have a bowel movement). This is rare but serious. You may have a very bad herniated disk in the lower back. How is this treated? This condition may be  treated with:  A short period of rest. This is usually the first treatment. ? You may be on bed rest for up to 2 days, or you may be told to stay home and avoid being active. ? If you have a herniated disk in your lower back, limit how much you sit. Sitting puts more pressure on the disk.  Medicines. These may include: ? NSAIDs, such as ibuprofen, to help reduce pain and swelling. ? Muscle relaxants so that the back muscles do not tighten all of a sudden (spasm). ? Prescription pain medicines, if you have very bad pain.  Ice or heat therapy.  Steroid shots (injections) in the area of the herniated disk. These can help reduce pain and swelling.  Physical therapy to strengthen your back muscles. In many cases, symptoms go away with treatment after days or weeks have passed. Your symptoms will most likely be gone after 3-4 months. If other treatments do not help, you may need surgery. Follow these instructions at home: Medicines  Take over-the-counter and prescription medicines only as told by your doctor.  Ask your doctor if the medicine prescribed to you: ? Requires you to avoid driving or using heavy machinery. ? Can cause trouble pooping (constipation). You may need to take these actions to prevent or treat trouble pooping:  Drink enough fluid to keep your pee (urine) pale yellow.    Take over-the-counter or prescription medicines.  Eat foods that are high in fiber. These include beans, whole grains, and fresh fruits and vegetables.  Limit foods that are high in fat and processed sugars. These include fried or sweet foods. Managing pain, stiffness, and swelling      If told, put ice on the painful area. To do this: ? Put ice in a plastic bag. ? Place a towel between your skin and the bag. ? Leave the ice on for 20 minutes, 2-3 times a day.  If told, put heat on the painful area. Do this as often as told by your doctor. Use the heat source that your doctor recommends, such as  a moist heat pack or a heating pad. ? Place a towel between your skin and the heat source. ? Leave the heat on for 20-30 minutes. ? Remove the heat if your skin turns bright red. This is very important if you are unable to feel pain, heat, or cold. You may have a greater risk of getting burned. Activity  Rest as told by your doctor.  After your rest period: ? Return to your normal activities as told by your doctor. Slowly start doing exercises as told. Ask your doctor what activities and exercises are safe for you. ? Use good posture. ? Avoid movements that cause pain. ? Do not lift anything that is heavier than 10 lb (4.5 kg), or the limit that you are told, until your doctor says that it is safe. ? Do not sit or stand for a long time without moving. ? Do not sit for a long time without getting up and moving around.  If exercises (physical therapy) were prescribed, do them as told by your doctor.  Try to strengthen your back and belly (abdomen) with exercises such as swimming or walking. General instructions  Do not use any products that contain nicotine or tobacco, such as cigarettes, e-cigarettes, and chewing tobacco. If you need help quitting, ask your doctor.  Do not wear high-heeled shoes.  Do not sleep on your belly.  If you are overweight, work with your doctor to lose weight safely.  Keep all follow-up visits as told by your doctor. This is important. How is this prevented?   Stay at a healthy weight.  Stay in shape. Do at least 150 minutes of moderate-intensity exercise each week, such as fast walking or water aerobics.  When lifting objects: ? Keep your feet as far apart as your shoulders or farther apart. ? Tighten your belly muscles. ? Bend your knees and hips, and keep your spine neutral. Lift using the strength of your legs, not your back. Do not lock your knees straight out. ? Always ask for help to lift heavy or awkward objects. Contact a doctor if:  You  have back pain or neck pain that does not get better after 6 weeks.  You have very bad pain in your back, neck, legs, or arms.  You get any of these problems in any part of your body: ? Tingling. ? Weakness. ? Loss of feeling. Get help right away if:  You cannot move your arms or legs.  You cannot control when you pee or poop.  You feel dizzy.  You faint.  You have trouble breathing. Summary  A herniated disk happens when a disk in your backbone bulges out too far.  This condition may be caused by wear and tear as you age or a sudden injury.  Symptoms may vary   depending on where your herniated disk is in your body.  Treatment may include rest, medicines, ice or heat therapy, steroid shots, and exercises.  You may need surgery if other treatments do not help. This information is not intended to replace advice given to you by your health care provider. Make sure you discuss any questions you have with your health care provider. Document Revised: 07/13/2019 Document Reviewed: 07/13/2019 Elsevier Patient Education  2020 [employer]  

## 2020-12-23 NOTE — Transfer of Care (Signed)
Immediate Anesthesia Transfer of Care Note  Patient: Allen Page  Procedure(s) Performed: RIGHT LUMBAR FOUR-FIVE LUMBAR LAMINECTOMY/DECOMPRESSION MICRODISCECTOMY (Right Spine Lumbar)  Patient Location: PACU  Anesthesia Type:General  Level of Consciousness: awake, alert  and oriented  Airway & Oxygen Therapy: Patient Spontanous Breathing  Post-op Assessment: Report given to RN and Post -op Vital signs reviewed and stable  Post vital signs: Reviewed and stable  Last Vitals:  Vitals Value Taken Time  BP    Temp    Pulse 60 12/23/20 1424  Resp 16 12/23/20 1424  SpO2 100 % 12/23/20 1424  Vitals shown include unvalidated device data.  Last Pain:  Vitals:   12/23/20 1103  TempSrc:   PainSc: 2          Complications: No complications documented.

## 2020-12-23 NOTE — Progress Notes (Signed)
   Providing Compassionate, Quality Care - Together  NEUROSURGERY PROGRESS NOTE   S: No issues overnight.  Slightly worse right lower extremity pain  O: EXAM:  BP 121/77   Pulse 76   Temp 98.8 F (37.1 C) (Oral)   Resp 18   Wt 90.7 kg   SpO2 100%   Awake, alert, oriented  Speech fluent, appropriate  CNs grossly intact  5/5 BUE 4/5 proximal, 4-/5 distal BLE   ASSESSMENT:  45 y.o. male with  1.  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  Covid  PLAN: -OR today for L4-5 microdiscectomy -All risks, benefits and alternatives discussed with the patient and agreed upon.  We discussed expected outcomes as well.  As well as the expected postoperative recovery. -It is unknown how his Covid status will play a role in his recovery and surgery, I did discuss this with him.  However given his progressive worsening and numbness in his groin I recommend surgical intervention for preservation of neurologic function    Thank you for allowing me to participate in this patient's care.  Please do not hesitate to call with questions or concerns.   Monia Pouch, DO Neurosurgeon [facility] Cell: [phone]

## 2020-12-23 NOTE — Progress Notes (Signed)
   Providing Compassionate, Quality Care - Together  NEUROSURGERY PROGRESS NOTE   S: patient s/e in recovery, denies leg pain, states he has improved numbness BLE/groin  O: EXAM:  BP 121/77   Pulse 76   Temp 98.8 F (37.1 C) (Oral)   Resp 18   Wt 90.7 kg   SpO2 100%   Awake, alert Speech fluent, appropriate  CNs grossly intact  5/5 BUE 4+/5 BLE  Incision c/d/i SILT  ASSESSMENT:  45 y.o. male with  1. L4-5 HNP with cauda equina syndrome  S/p L4-5 right MD on 12/23/2020  PLAN: - dc home - rx sent to pharmacy as outpatient - instructions for wound/activity given - doing very well, neuro improved    Thank you for allowing me to participate in this patient's care.  Please do not hesitate to call with questions or concerns.   Monia Pouch, DO Neurosurgeon [facility] Cell: [phone]

## 2020-12-23 NOTE — Anesthesia Procedure Notes (Signed)
Procedure Name: Intubation Date/Time: 12/23/2020 11:39 AM Performed by: Elliot Dally, CRNA Pre-anesthesia Checklist: Patient identified, Emergency Drugs available, Suction available and Patient being monitored Patient Re-evaluated:Patient Re-evaluated prior to induction Oxygen Delivery Method: Circle System Utilized Preoxygenation: Pre-oxygenation with 100% oxygen Induction Type: IV induction Ventilation: Mask ventilation without difficulty Laryngoscope Size: Miller and 2 Grade View: Grade I Tube type: Oral Tube size: 7.5 mm Number of attempts: 1 Airway Equipment and Method: Stylet and Oral airway Placement Confirmation: ETT inserted through vocal cords under direct vision,  positive ETCO2 and breath sounds checked- equal and bilateral Secured at: 24 cm Tube secured with: Tape Dental Injury: Teeth and Oropharynx as per pre-operative assessment

## 2020-12-23 NOTE — Anesthesia Preprocedure Evaluation (Signed)
Anesthesia Evaluation  Patient identified by MRN, date of birth, ID band Patient awake    Reviewed: Allergy & Precautions, NPO status , Patient's Chart, lab work & pertinent test results  Airway Mallampati: II  TM Distance: >3 FB Neck ROM: Full    Dental  (+) Teeth Intact, Dental Advisory Given, Chipped,    Pulmonary neg pulmonary ROS,  COVID +   Pulmonary exam normal breath sounds clear to auscultation       Cardiovascular negative cardio ROS Normal cardiovascular exam Rhythm:Regular Rate:Normal     Neuro/Psych Cauda equina  Neuromuscular disease    GI/Hepatic negative GI ROS, Neg liver ROS,   Endo/Other  negative endocrine ROS  Renal/GU negative Renal ROS     Musculoskeletal negative musculoskeletal ROS (+)   Abdominal   Peds  Hematology negative hematology ROS (+)   Anesthesia Other Findings Day of surgery medications reviewed with the patient.  Reproductive/Obstetrics                             Anesthesia Physical Anesthesia Plan  ASA: II and emergent  Anesthesia Plan: General   Post-op Pain Management:    Induction: Intravenous  PONV Risk Score and Plan: 2 and Midazolam, Dexamethasone and Ondansetron  Airway Management Planned: Oral ETT  Additional Equipment:   Intra-op Plan:   Post-operative Plan: Extubation in OR  Informed Consent: I have reviewed the patients History and Physical, chart, labs and discussed the procedure including the risks, benefits and alternatives for the proposed anesthesia with the patient or authorized representative who has indicated his/her understanding and acceptance.     Dental advisory given  Plan Discussed with: CRNA  Anesthesia Plan Comments: (Preop eval completed after induction of anesthesia due to patient being brought directly to OR due to COVID + status. Pre-op completed in OR.)        Anesthesia Quick Evaluation

## 2020-12-23 NOTE — OR Nursing (Signed)
Patient recovered in OR starting at 1332 by OR RN and CRNA due to PACU staff being unavailable

## 2020-12-23 NOTE — ED Notes (Signed)
Updated pts wife.

## 2020-12-23 NOTE — Op Note (Signed)
Date of service: 12/23/2020  PREOP DIAGNOSIS: Lumbar disc herniation, L4-5, cauda equina syndrome  POSTOP DIAGNOSIS: Same  PROCEDURE: 1.  L4-5 right minimally invasive laminectomy, partial facetectomy and microdiscectomy for decompression of nerve root L 5, bilateral 2. Use of operating microscope 3. Use of intraoperative fluoroscopy  SURGEON: Dr. Kendell Bane C. Yeudiel Mateo, DO  ASSISTANT: Dr. Maeola Harman, MD  ANESTHESIA: General Endotracheal  EBL: 20 cc  SPECIMENS: None  DRAINS: None  COMPLICATIONS: None  CONDITION: Hemodynamically stable  HISTORY: Allen Page is a 45 y.o. male who presented with bilateral lower extremity radicular pain, weakness while walking as well as worsening groin numbness for the past 2 weeks.  He has been battling bilateral leg pain for few months as well as numbness in his groin however it worsened over the past 2 weeks.  He stated that he had numbness to feeling in his groin however he could control his urination.  He did not have any episodes of incontinence but he had difficulty initiating and feeling when he was urinating.  He had normal rectal tone.  MRI revealed a very large extruded disc fragment at L4-5 with severe central and bilateral lateral recess stenosis.  We discussed the risks and benefits and alternatives of surgical decompression given his progressive symptoms and perineal numbness.  All risks and benefits were agreed upon and he elected to proceed with surgery.  PROCEDURE IN DETAIL: After informed consent was obtained and witnessed, the patient was brought to the operating room. After induction of general anesthesia, the patient was positioned on the operative table in the prone position with all pressure points meticulously padded. The skin of the low back was then prepped and draped in the usual sterile fashion.  Under fluoroscopy, the L L4-5 level was identified and marked out on the skin, and after timeout was conducted. Skin incision was  then made sharply with a 10 blade and Bovie electrocautery was used to dissect the subcutaneous tissue until the lumbodorsal fascia was identified. The fascia was then incised using Bovie electrocautery and the lamina at the L4 levels was identified and dissection was carried out in the subperiosteal plane using the Metrx dilators.  An appropriate sized Metrx tube was placed, and intraoperative x-ray was taken to confirm appropriate placement.  The microscope was sterilely draped and brought into the field and used for the remainder of the case.  Using Bovie electrocautery, soft tissue was cleared from the lamina and medial facet at L4-5.  Using a high-speed drill, laminotomy was completed with a partial medial facetectomy. The ligamentum flavum was then identified and removed and the lateral edge of the thecal sac was identified.  The thecal sac was clearly severely tented over the disc herniation.  This was then traced down to identify the traversing nerve root. Dissection was then carried out superior and lateral to the nerve root to identify the disc herniation. The posterior annulus was then coagulated with bipolar cautery and incised with an 11 blade and using a combination of dissectors, curettes, and Rogers, the herniated disc fragment was removed. The decompression of the nerve root was confirmed using a dissector.  There were multiple very large extruded disc fragments from the central canal and left lateral recess and right lateral recess.  The thecal sac at this point appeared relaxed and pulsatile.  I could trace the right L5 nerve and thecal sac inferiorly and medially and and felt adequately decompressed.  Hemostasis was then secured using a combination of morcellized Gelfoam  and thrombin and bipolar electrocautery. The wound is irrigated with copious amounts of antibiotic saline irrigation.  The traversing and exiting nerve roots were felt with a Murphy ball probe and noted to be decompressed.   The wound was noted to be excellently hemostatic.  The nerve root was then covered with a long-acting steroid solution.  The Metrx tube was then removed, hemostasis was achieved with bipolar cautery and the wound is closed in layers using 2-0 Vicryl stitches. The skin was closed using standard skin glue.  Sterile dressing was applied.  Drapes were taken down.  At the end of the case all sponge, needle, and instrument counts were correct. The patient was then fully transferred to the stretcher, extubated and taken to the postanesthesia care unit in stable hemodynamic condition.

## 2020-12-24 ENCOUNTER — Encounter (HOSPITAL_COMMUNITY): Payer: Self-pay | Admitting: Neurological Surgery

## 2022-01-02 IMAGING — CR DG LUMBAR SPINE 2-3V
3 series · 3 of 3 positions shown · non-contrast
Comparison: None.

CLINICAL DATA: Low back pain with left-sided radicular symptoms

EXAM:
LUMBAR SPINE - 2-3 VIEW

[t l-spine a.p.]
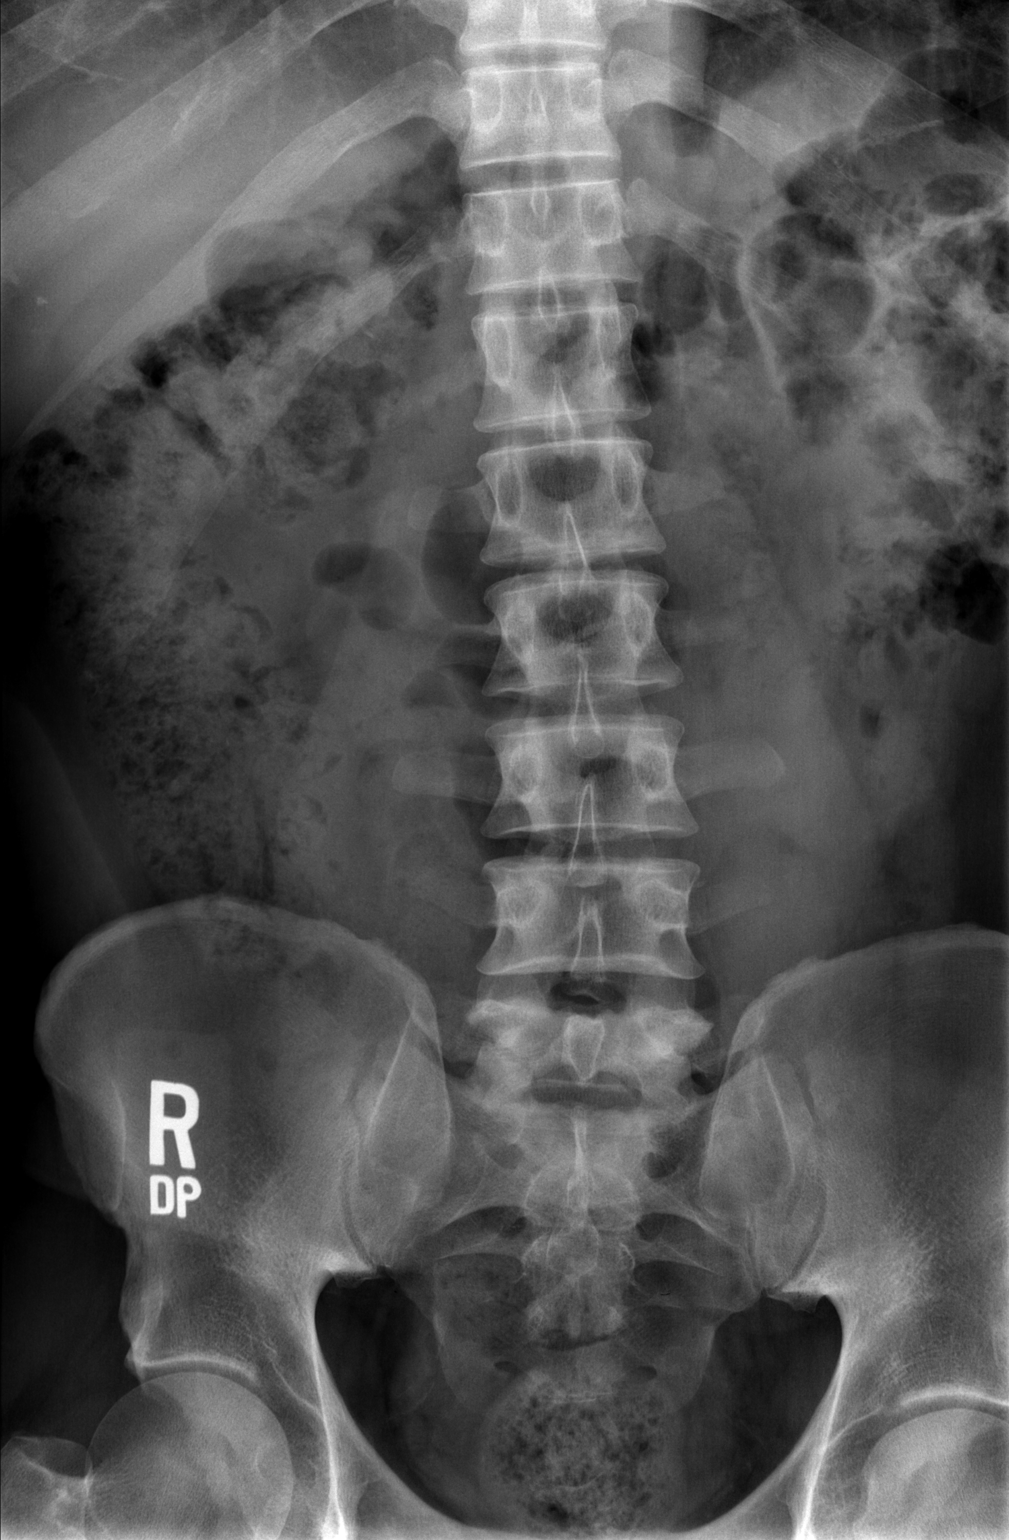

[t l-spine lat]
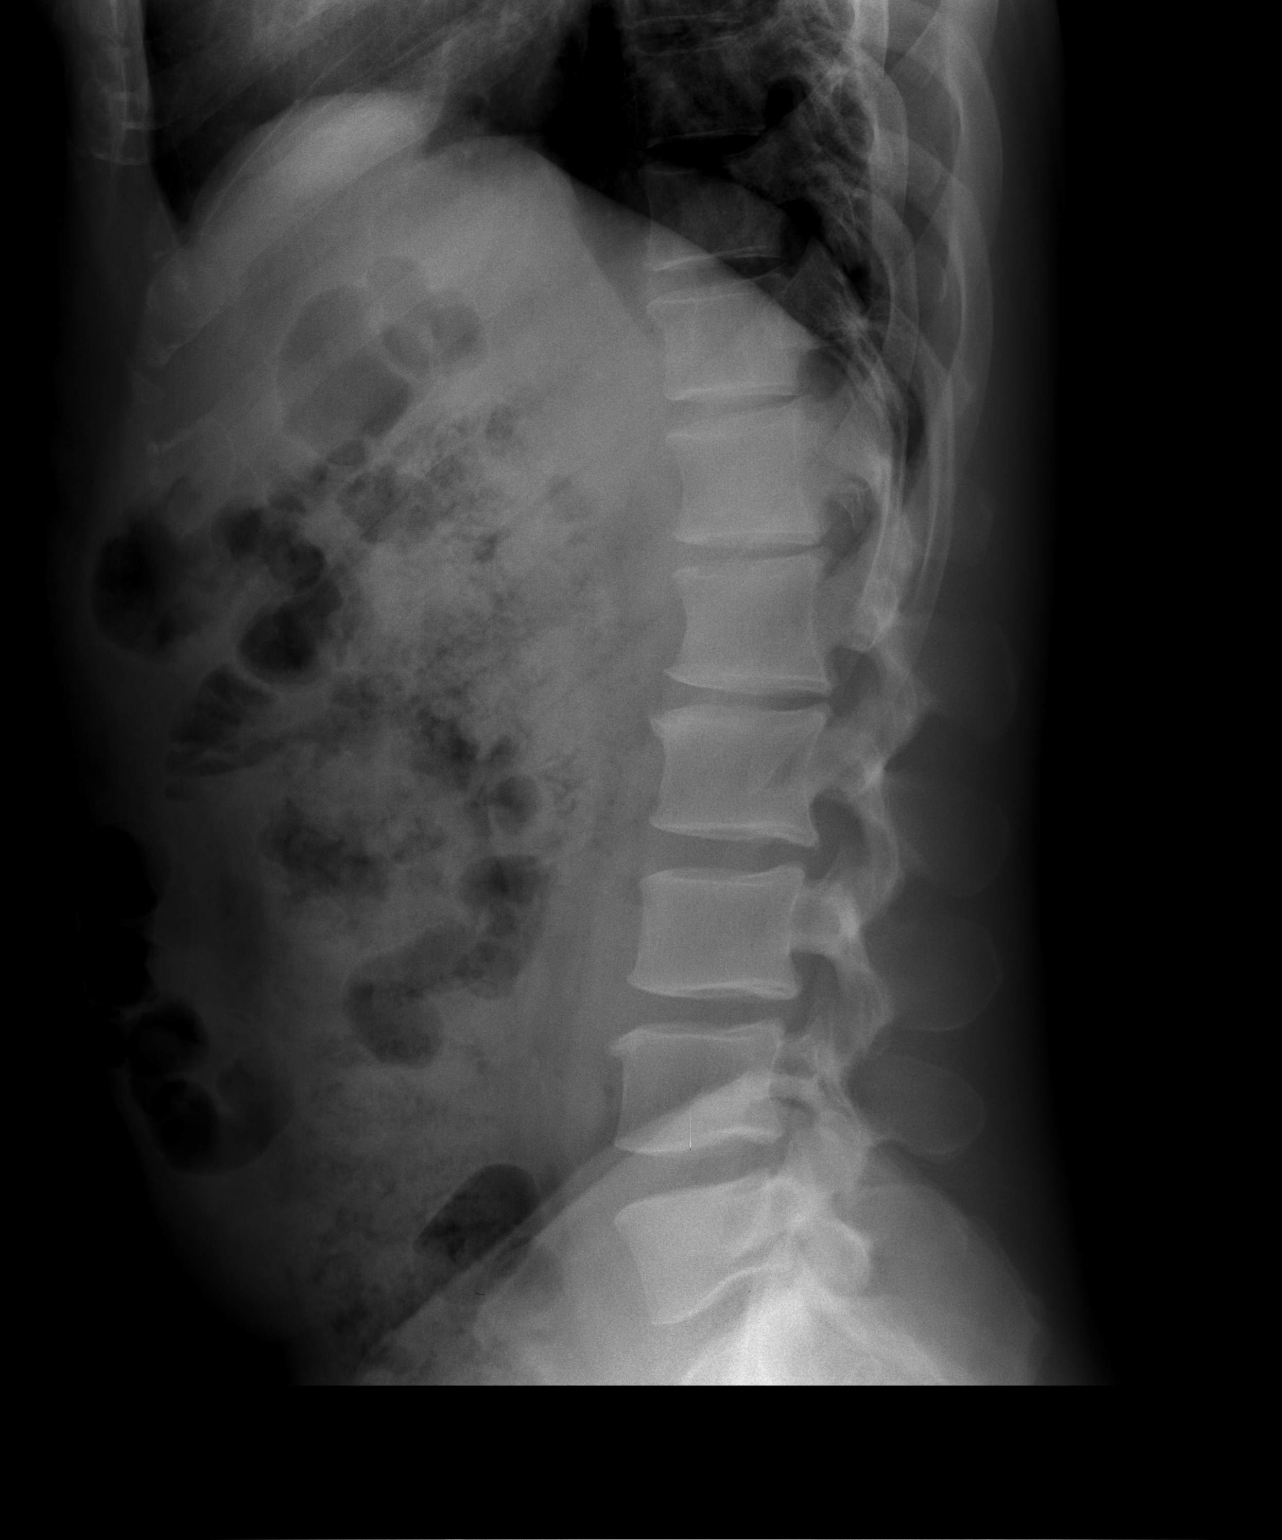

[t l-spine l5-s1 spot]
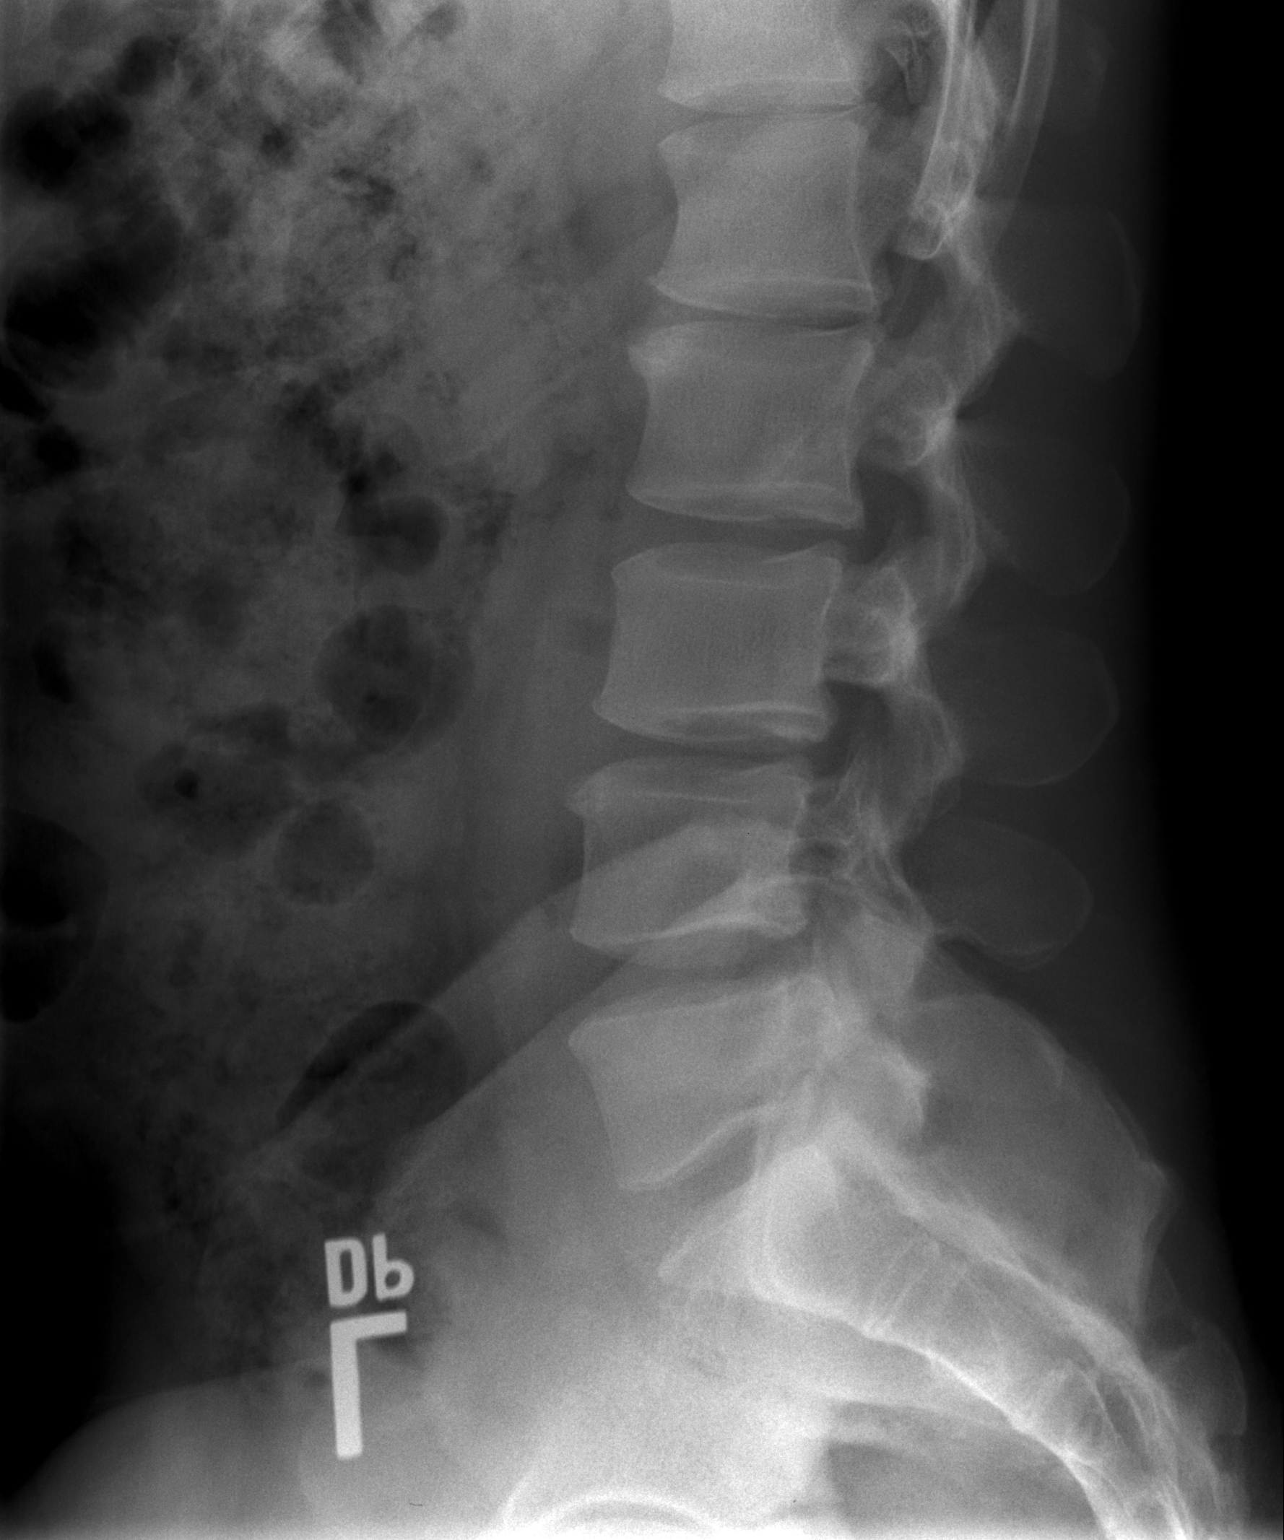

[3 of 3 positions shown; findings below may reference images not displayed]

FINDINGS: Frontal, lateral, and spot lumbosacral lateral images were obtained.
There are 5 non-rib-bearing lumbar type vertebral bodies. T12 ribs
are hypoplastic. There is lumbar levoscoliosis. No fracture or
spondylolisthesis. There is disc space narrowing at L1-2. Other disc
spaces appear normal. No erosive change.
IMPRESSION: Mild scoliosis. Focal disc space narrowing at L1-2. Other disc
spaces appear unremarkable. No fracture or spondylolisthesis.
